# Patient Record
Sex: Male | Born: 1995 | Race: White | Marital: Single | Smoking: Never smoker
Health system: Southern US, Community
[De-identification: ages and names within clinical notes are randomized; demographics above are authoritative.]

---
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
## 2023-02-13 ENCOUNTER — Emergency Department (HOSPITAL_COMMUNITY): Payer: Self-pay

## 2023-02-13 ENCOUNTER — Encounter (HOSPITAL_COMMUNITY): Payer: Self-pay

## 2023-02-13 ENCOUNTER — Other Ambulatory Visit: Payer: Self-pay

## 2023-02-13 ENCOUNTER — Inpatient Hospital Stay (HOSPITAL_COMMUNITY)
Admission: EM | Admit: 2023-02-13 | Discharge: 2023-02-14 | DRG: 981 | Disposition: A | Discharge status: Home / Self Care | Payer: Self-pay | Attending: Student | Admitting: Student

## 2023-02-13 DIAGNOSIS — S72411B Displaced unspecified condyle fracture of lower end of right femur, initial encounter for open fracture type I or II: Secondary | ICD-10-CM | POA: Insufficient documentation

## 2023-02-13 DIAGNOSIS — M109 Gout, unspecified: Secondary | ICD-10-CM | POA: Diagnosis present

## 2023-02-13 DIAGNOSIS — S81031A Puncture wound without foreign body, right knee, initial encounter: Principal | ICD-10-CM | POA: Diagnosis present

## 2023-02-13 DIAGNOSIS — S72431B Displaced fracture of medial condyle of right femur, initial encounter for open fracture type I or II: Secondary | ICD-10-CM | POA: Diagnosis present

## 2023-02-13 DIAGNOSIS — M12561 Traumatic arthropathy, right knee: Secondary | ICD-10-CM | POA: Diagnosis present

## 2023-02-13 DIAGNOSIS — S0121XA Laceration without foreign body of nose, initial encounter: Secondary | ICD-10-CM | POA: Diagnosis present

## 2023-02-13 DIAGNOSIS — S022XXA Fracture of nasal bones, initial encounter for closed fracture: Secondary | ICD-10-CM | POA: Diagnosis present

## 2023-02-13 DIAGNOSIS — Y92410 Unspecified street and highway as the place of occurrence of the external cause: Secondary | ICD-10-CM | POA: Diagnosis not present

## 2023-02-13 DIAGNOSIS — M12569 Traumatic arthropathy, unspecified knee: Secondary | ICD-10-CM | POA: Diagnosis present

## 2023-02-13 DIAGNOSIS — Z23 Encounter for immunization: Secondary | ICD-10-CM

## 2023-02-13 LAB — COMPREHENSIVE METABOLIC PANEL
ALT: 21 U/L (ref 0–44)
AST: 28 U/L (ref 15–41)
Albumin: 4.7 g/dL (ref 3.5–5.0)
Alkaline Phosphatase: 64 U/L (ref 38–126)
Anion gap: 15 (ref 5–15)
BUN: 16 mg/dL (ref 6–20)
CO2: 25 mmol/L (ref 22–32)
Calcium: 9.5 mg/dL (ref 8.9–10.3)
Chloride: 95 mmol/L — ABNORMAL LOW (ref 98–111)
Creatinine, Ser: 0.91 mg/dL (ref 0.61–1.24)
GFR, Estimated: >60 mL/min (ref >60)
Glucose, Bld: 103 mg/dL — ABNORMAL HIGH (ref 70–99)
Potassium: 3.4 mmol/L — ABNORMAL LOW (ref 3.5–5.1)
Sodium: 135 mmol/L (ref 135–145)
Total Bilirubin: 1.1 mg/dL (ref 0.3–1.2)
Total Protein: 7.2 g/dL (ref 6.5–8.1)

## 2023-02-13 LAB — CBC WITH DIFFERENTIAL/PLATELET
Abs Immature Granulocytes: 0.17 10*3/uL — ABNORMAL HIGH (ref 0.00–0.07)
Basophils Absolute: 0.1 10*3/uL (ref 0.0–0.1)
Basophils Relative: 0 %
Eosinophils Absolute: 0.1 10*3/uL (ref 0.0–0.5)
Eosinophils Relative: 0 %
HCT: 45.2 % (ref 39.0–52.0)
Hemoglobin: 16.8 g/dL (ref 13.0–17.0)
Immature Granulocytes: 1 %
Lymphocytes Relative: 8 %
Lymphs Abs: 1.5 10*3/uL (ref 0.7–4.0)
MCH: 31.5 pg (ref 26.0–34.0)
MCHC: 37.2 g/dL — ABNORMAL HIGH (ref 30.0–36.0)
MCV: 84.6 fL (ref 80.0–100.0)
Monocytes Absolute: 1.3 10*3/uL — ABNORMAL HIGH (ref 0.1–1.0)
Monocytes Relative: 7 %
Neutro Abs: 17 10*3/uL — ABNORMAL HIGH (ref 1.7–7.7)
Neutrophils Relative %: 84 %
Platelets: 279 10*3/uL (ref 150–400)
RBC: 5.34 MIL/uL (ref 4.22–5.81)
RDW: 11.3 % — ABNORMAL LOW (ref 11.5–15.5)
WBC: 20.2 10*3/uL — ABNORMAL HIGH (ref 4.0–10.5)
nRBC: 0 % (ref 0.0–0.2)

## 2023-02-13 LAB — HIV ANTIBODY (ROUTINE TESTING W REFLEX): HIV Screen 4th Generation wRfx: NONREACTIVE

## 2023-02-13 LAB — ETHANOL: Alcohol, Ethyl (B): <10 mg/dL (ref <10)

## 2023-02-13 MED ORDER — IBUPROFEN 800 MG PO TABS: 800.0000 mg | ORAL_TABLET | Freq: Once | ORAL | Status: DC

## 2023-02-13 MED ORDER — OXYCODONE-ACETAMINOPHEN 5-325 MG PO TABS
1.0000 | ORAL_TABLET | Freq: Once | ORAL | Status: AC
  Administered 2023-02-13: 1 via ORAL
  Filled 2023-02-13: qty 1

## 2023-02-13 MED ORDER — CEFAZOLIN SODIUM-DEXTROSE 2-4 GM/100ML-% IV SOLN
2.0000 g | Freq: Three times a day (TID) | INTRAVENOUS | Status: DC
  Administered 2023-02-14 (×2): 2 g via INTRAVENOUS
  Filled 2023-02-13: qty 100

## 2023-02-13 MED ORDER — DOCUSATE SODIUM 100 MG PO CAPS
100.0000 mg | ORAL_CAPSULE | Freq: Two times a day (BID) | ORAL | Status: DC
  Administered 2023-02-13 – 2023-02-14 (×2): 100 mg via ORAL
  Filled 2023-02-13 (×2): qty 1

## 2023-02-13 MED ORDER — OXYCODONE HCL 5 MG PO TABS
5.0000 mg | ORAL_TABLET | ORAL | Status: DC | PRN
  Filled 2023-02-13: qty 2

## 2023-02-13 MED ORDER — METHOCARBAMOL 500 MG PO TABS: 1000.0000 mg | ORAL_TABLET | Freq: Three times a day (TID) | ORAL | Status: DC | PRN

## 2023-02-13 MED ORDER — ACETAMINOPHEN 500 MG PO TABS
1000.0000 mg | ORAL_TABLET | Freq: Four times a day (QID) | ORAL | Status: DC
  Administered 2023-02-13 – 2023-02-14 (×3): 1000 mg via ORAL
  Filled 2023-02-13 (×3): qty 2

## 2023-02-13 MED ORDER — METHOCARBAMOL 1000 MG/10ML IJ SOLN: 500.0000 mg | Freq: Three times a day (TID) | INTRAVENOUS | Status: DC | PRN

## 2023-02-13 MED ORDER — LACTATED RINGERS IV SOLN: INTRAVENOUS | Status: DC

## 2023-02-13 MED ORDER — ACETAMINOPHEN 325 MG PO TABS: 325.0000 mg | ORAL_TABLET | Freq: Four times a day (QID) | ORAL | Status: DC | PRN

## 2023-02-13 MED ORDER — CEFAZOLIN SODIUM-DEXTROSE 2-4 GM/100ML-% IV SOLN
2.0000 g | Freq: Once | INTRAVENOUS | Status: AC
  Administered 2023-02-13: 2 g via INTRAVENOUS
  Filled 2023-02-13: qty 100

## 2023-02-13 MED ORDER — HYDROCODONE-ACETAMINOPHEN 5-325 MG PO TABS
1.0000 | ORAL_TABLET | Freq: Once | ORAL | Status: AC
  Administered 2023-02-13: 1 via ORAL
  Filled 2023-02-13: qty 1

## 2023-02-13 MED ORDER — LIDOCAINE HCL (PF) 1 % IJ SOLN
5.0000 mL | Freq: Once | INTRAMUSCULAR | Status: AC
  Administered 2023-02-13: 5 mL
  Filled 2023-02-13: qty 5

## 2023-02-13 MED ORDER — KETOROLAC TROMETHAMINE 15 MG/ML IJ SOLN
15.0000 mg | Freq: Four times a day (QID) | INTRAMUSCULAR | Status: DC
  Administered 2023-02-13 – 2023-02-14 (×3): 15 mg via INTRAVENOUS
  Filled 2023-02-13 (×3): qty 1

## 2023-02-13 MED ORDER — IBUPROFEN 800 MG PO TABS
800.0000 mg | ORAL_TABLET | Freq: Once | ORAL | Status: AC
  Administered 2023-02-13: 800 mg via ORAL
  Filled 2023-02-13: qty 1

## 2023-02-13 MED ORDER — BISACODYL 5 MG PO TBEC: 5.0000 mg | DELAYED_RELEASE_TABLET | Freq: Every day | ORAL | Status: DC | PRN

## 2023-02-13 MED ORDER — HYDROMORPHONE HCL 1 MG/ML IJ SOLN: 0.5000 mg | INTRAMUSCULAR | Status: DC | PRN

## 2023-02-13 MED ORDER — TETANUS-DIPHTH-ACELL PERTUSSIS 5-2.5-18.5 LF-MCG/0.5 IM SUSY
0.5000 mL | PREFILLED_SYRINGE | Freq: Once | INTRAMUSCULAR | Status: AC
  Administered 2023-02-13: 0.5 mL via INTRAMUSCULAR
  Filled 2023-02-13: qty 0.5

## 2023-02-13 MED ORDER — MAGNESIUM HYDROXIDE 400 MG/5ML PO SUSP: 30.0000 mL | Freq: Every day | ORAL | Status: DC | PRN
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
## 2023-02-13 NOTE — Progress Notes (Signed)
Tried to do initial assessment but pt sleeping well.  Pt partner stated that he just requested pain medicine but pt now sleeping well.
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
## 2023-02-14 ENCOUNTER — Inpatient Hospital Stay (HOSPITAL_COMMUNITY): Payer: Self-pay | Admitting: Certified Registered Nurse Anesthetist

## 2023-02-14 ENCOUNTER — Other Ambulatory Visit: Payer: Self-pay

## 2023-02-14 ENCOUNTER — Encounter (HOSPITAL_COMMUNITY)
Admission: EM | Disposition: A | Discharge status: Home / Self Care | Payer: Self-pay | Source: Home / Self Care | Attending: Orthopedic Surgery

## 2023-02-14 DIAGNOSIS — M12561 Traumatic arthropathy, right knee: Secondary | ICD-10-CM

## 2023-02-14 DIAGNOSIS — S72431B Displaced fracture of medial condyle of right femur, initial encounter for open fracture type I or II: Secondary | ICD-10-CM

## 2023-02-14 DIAGNOSIS — S72411B Displaced unspecified condyle fracture of lower end of right femur, initial encounter for open fracture type I or II: Secondary | ICD-10-CM | POA: Insufficient documentation

## 2023-02-14 HISTORY — PX: KNEE ARTHROTOMY: SHX5881

## 2023-02-14 LAB — SURGICAL PCR SCREEN
MRSA, PCR: NEGATIVE
Staphylococcus aureus: NEGATIVE

## 2023-02-14 LAB — RAPID URINE DRUG SCREEN, HOSP PERFORMED
Amphetamines: NOT DETECTED
Barbiturates: NOT DETECTED
Benzodiazepines: NOT DETECTED
Cocaine: NOT DETECTED
Opiates: POSITIVE — AB
Tetrahydrocannabinol: NOT DETECTED

## 2023-02-14 SURGERY — ARTHROTOMY, KNEE
Anesthesia: General | Site: Knee | Laterality: Right

## 2023-02-14 MED ORDER — CEFAZOLIN SODIUM-DEXTROSE 2-4 GM/100ML-% IV SOLN
2.0000 g | Freq: Three times a day (TID) | INTRAVENOUS | Status: DC
  Administered 2023-02-14: 2 g via INTRAVENOUS
  Filled 2023-02-14: qty 100

## 2023-02-14 MED ORDER — FENTANYL CITRATE (PF) 100 MCG/2ML IJ SOLN: 25.0000 ug | INTRAMUSCULAR | Status: DC | PRN

## 2023-02-14 MED ORDER — ONDANSETRON HCL 4 MG/2ML IJ SOLN
INTRAMUSCULAR | Status: DC | PRN
  Administered 2023-02-14: 4 mg via INTRAVENOUS

## 2023-02-14 MED ORDER — LIDOCAINE 2% (20 MG/ML) 5 ML SYRINGE
INTRAMUSCULAR | Status: AC
  Filled 2023-02-14: qty 5

## 2023-02-14 MED ORDER — DEXAMETHASONE SODIUM PHOSPHATE 10 MG/ML IJ SOLN
INTRAMUSCULAR | Status: DC | PRN
  Administered 2023-02-14: 10 mg via INTRAVENOUS

## 2023-02-14 MED ORDER — FENTANYL CITRATE (PF) 250 MCG/5ML IJ SOLN
INTRAMUSCULAR | Status: DC | PRN
  Administered 2023-02-14: 50 ug via INTRAVENOUS
  Administered 2023-02-14: 25 ug via INTRAVENOUS

## 2023-02-14 MED ORDER — MIDAZOLAM HCL 2 MG/2ML IJ SOLN
INTRAMUSCULAR | Status: AC
  Filled 2023-02-14: qty 2

## 2023-02-14 MED ORDER — PHENYLEPHRINE 80 MCG/ML (10ML) SYRINGE FOR IV PUSH (FOR BLOOD PRESSURE SUPPORT)
PREFILLED_SYRINGE | INTRAVENOUS | Status: AC
  Filled 2023-02-14: qty 10

## 2023-02-14 MED ORDER — CHLORHEXIDINE GLUCONATE 4 % EX LIQD
60.0000 mL | Freq: Once | CUTANEOUS | Status: AC
  Administered 2023-02-14: 4 via TOPICAL
  Filled 2023-02-14: qty 60

## 2023-02-14 MED ORDER — POVIDONE-IODINE 10 % EX SWAB: 2.0000 | Freq: Once | CUTANEOUS | Status: DC

## 2023-02-14 MED ORDER — ASPIRIN 81 MG PO TBEC: 81.0000 mg | DELAYED_RELEASE_TABLET | Freq: Every day | ORAL | 0 refills | Status: AC

## 2023-02-14 MED ORDER — CEPHALEXIN 500 MG PO CAPS: 500.0000 mg | ORAL_CAPSULE | Freq: Four times a day (QID) | ORAL | 0 refills | Status: AC

## 2023-02-14 MED ORDER — PROPOFOL 10 MG/ML IV BOLUS
INTRAVENOUS | Status: DC | PRN
  Administered 2023-02-14: 200 mg via INTRAVENOUS

## 2023-02-14 MED ORDER — ONDANSETRON HCL 4 MG/2ML IJ SOLN: 4.0000 mg | Freq: Four times a day (QID) | INTRAMUSCULAR | Status: DC | PRN

## 2023-02-14 MED ORDER — LACTATED RINGERS IV SOLN: INTRAVENOUS | Status: DC | PRN

## 2023-02-14 MED ORDER — VANCOMYCIN HCL 1000 MG IV SOLR
INTRAVENOUS | Status: AC
  Filled 2023-02-14: qty 20

## 2023-02-14 MED ORDER — BUPIVACAINE HCL (PF) 0.5 % IJ SOLN
INTRAMUSCULAR | Status: DC | PRN
  Administered 2023-02-14: 25 mL via PERINEURAL

## 2023-02-14 MED ORDER — FENTANYL CITRATE (PF) 250 MCG/5ML IJ SOLN
INTRAMUSCULAR | Status: AC
  Filled 2023-02-14: qty 5

## 2023-02-14 MED ORDER — ROCURONIUM BROMIDE 10 MG/ML (PF) SYRINGE
PREFILLED_SYRINGE | INTRAVENOUS | Status: AC
  Filled 2023-02-14: qty 10

## 2023-02-14 MED ORDER — CEFAZOLIN SODIUM-DEXTROSE 2-4 GM/100ML-% IV SOLN
INTRAVENOUS | Status: AC
  Filled 2023-02-14: qty 100

## 2023-02-14 MED ORDER — MIDAZOLAM HCL 2 MG/2ML IJ SOLN
INTRAMUSCULAR | Status: DC | PRN
  Administered 2023-02-14 (×2): 1 mg via INTRAVENOUS

## 2023-02-14 MED ORDER — PROPOFOL 10 MG/ML IV BOLUS
INTRAVENOUS | Status: AC
  Filled 2023-02-14: qty 20

## 2023-02-14 MED ORDER — OXYCODONE HCL 5 MG/5ML PO SOLN: 5.0000 mg | Freq: Once | ORAL | Status: DC | PRN

## 2023-02-14 MED ORDER — LIDOCAINE 2% (20 MG/ML) 5 ML SYRINGE
INTRAMUSCULAR | Status: DC | PRN
  Administered 2023-02-14: 60 mg via INTRAVENOUS

## 2023-02-14 MED ORDER — OXYCODONE HCL 5 MG PO TABS: 5.0000 mg | ORAL_TABLET | Freq: Once | ORAL | Status: DC | PRN

## 2023-02-14 MED ORDER — DEXAMETHASONE SODIUM PHOSPHATE 10 MG/ML IJ SOLN
INTRAMUSCULAR | Status: AC
  Filled 2023-02-14: qty 1

## 2023-02-14 MED ORDER — ONDANSETRON HCL 4 MG/2ML IJ SOLN
INTRAMUSCULAR | Status: AC
  Filled 2023-02-14: qty 2

## 2023-02-14 MED ORDER — 0.9 % SODIUM CHLORIDE (POUR BTL) OPTIME
TOPICAL | Status: DC | PRN
  Administered 2023-02-14: 1000 mL

## 2023-02-14 MED ORDER — OXYCODONE-ACETAMINOPHEN 5-325 MG PO TABS: 1.0000 | ORAL_TABLET | ORAL | 0 refills | Status: AC | PRN

## 2023-02-14 MED ORDER — VANCOMYCIN HCL 1000 MG IV SOLR
INTRAVENOUS | Status: DC | PRN
  Administered 2023-02-14: 1000 mg via TOPICAL

## 2023-02-14 MED ORDER — METHOCARBAMOL 1000 MG PO TABS: 500.0000 mg | ORAL_TABLET | Freq: Four times a day (QID) | ORAL | 0 refills | Status: AC | PRN

## 2023-02-14 SURGICAL SUPPLY — 50 items
APL PRP STRL LF DISP 70% ISPRP (MISCELLANEOUS) ×1
BAG COUNTER SPONGE SURGICOUNT (BAG) ×1 IMPLANT
BAG SPNG CNTER NS LX DISP (BAG)
BNDG COHESIVE 4X5 TAN STRL (GAUZE/BANDAGES/DRESSINGS) ×1 IMPLANT
BNDG ELASTIC 4X5.8 VLCR STR LF (GAUZE/BANDAGES/DRESSINGS) IMPLANT
BNDG GAUZE DERMACEA FLUFF 4 (GAUZE/BANDAGES/DRESSINGS) ×2 IMPLANT
BNDG GZE DERMACEA 4 6PLY (GAUZE/BANDAGES/DRESSINGS)
BRUSH SCRUB EZ PLAIN DRY (MISCELLANEOUS) ×2 IMPLANT
CHLORAPREP W/TINT 26 (MISCELLANEOUS) ×1 IMPLANT
COVER MAYO STAND STRL (DRAPES) ×1 IMPLANT
COVER SURGICAL LIGHT HANDLE (MISCELLANEOUS) ×2 IMPLANT
DRAPE ORTHO SPLIT 77X108 STRL (DRAPES) ×1
DRAPE SURG 17X23 STRL (DRAPES) ×1 IMPLANT
DRAPE SURG ORHT 6 SPLT 77X108 (DRAPES) ×1 IMPLANT
DRAPE U-SHAPE 47X51 STRL (DRAPES) ×1 IMPLANT
DRSG ADAPTIC 3X8 NADH LF (GAUZE/BANDAGES/DRESSINGS) ×1 IMPLANT
DRSG EMULSION OIL 3X3 NADH (GAUZE/BANDAGES/DRESSINGS) IMPLANT
ELECT REM PT RETURN 9FT ADLT (ELECTROSURGICAL) ×1
ELECTRODE REM PT RTRN 9FT ADLT (ELECTROSURGICAL) IMPLANT
EVACUATOR 1/8 PVC DRAIN (DRAIN) IMPLANT
GAUZE SPONGE 4X4 12PLY STRL (GAUZE/BANDAGES/DRESSINGS) ×1 IMPLANT
GLOVE BIO SURGEON STRL SZ 6.5 (GLOVE) ×3 IMPLANT
GLOVE BIO SURGEON STRL SZ7.5 (GLOVE) ×4 IMPLANT
GLOVE BIOGEL PI IND STRL 6.5 (GLOVE) ×1 IMPLANT
GLOVE BIOGEL PI IND STRL 7.5 (GLOVE) ×1 IMPLANT
GOWN STRL REUS W/ TWL LRG LVL3 (GOWN DISPOSABLE) ×2 IMPLANT
GOWN STRL REUS W/TWL LRG LVL3 (GOWN DISPOSABLE) ×2
HANDPIECE INTERPULSE COAX TIP (DISPOSABLE)
KIT BASIN OR (CUSTOM PROCEDURE TRAY) ×1 IMPLANT
KIT TURNOVER KIT B (KITS) ×1 IMPLANT
MANIFOLD NEPTUNE II (INSTRUMENTS) ×1 IMPLANT
NS IRRIG 1000ML POUR BTL (IV SOLUTION) ×1 IMPLANT
PACK ORTHO EXTREMITY (CUSTOM PROCEDURE TRAY) ×1 IMPLANT
PAD ARMBOARD 7.5X6 YLW CONV (MISCELLANEOUS) ×2 IMPLANT
PADDING CAST COTTON 6X4 STRL (CAST SUPPLIES) ×1 IMPLANT
PADDING CAST SYNTHETIC 4X4 STR (CAST SUPPLIES) IMPLANT
SET CYSTO W/LG BORE CLAMP LF (SET/KITS/TRAYS/PACK) IMPLANT
SET HNDPC FAN SPRY TIP SCT (DISPOSABLE) IMPLANT
SPONGE T-LAP 18X18 ~~LOC~~+RFID (SPONGE) ×1 IMPLANT
SUT ETHILON 2 0 FS 18 (SUTURE) ×2 IMPLANT
SUT ETHILON 3 0 PS 1 (SUTURE) ×2 IMPLANT
SUT MON AB 2-0 CT1 36 (SUTURE) ×1 IMPLANT
SUT PDS AB 0 CT 36 (SUTURE) IMPLANT
SWAB CULTURE ESWAB REG 1ML (MISCELLANEOUS) IMPLANT
TOWEL GREEN STERILE (TOWEL DISPOSABLE) ×2 IMPLANT
TOWEL GREEN STERILE FF (TOWEL DISPOSABLE) ×1 IMPLANT
TUBE CONNECTING 12X1/4 (SUCTIONS) ×1 IMPLANT
UNDERPAD 30X36 HEAVY ABSORB (UNDERPADS AND DIAPERS) ×1 IMPLANT
WATER STERILE IRR 1000ML POUR (IV SOLUTION) ×1 IMPLANT
YANKAUER SUCT BULB TIP NO VENT (SUCTIONS) ×1 IMPLANT

## 2023-02-14 NOTE — TOC Transition Note (Signed)
Transition of Care [facility]) - CM/SW Discharge Note   Patient Details  Name: Jacob Dominguez MRN: [id number] Date of Birth: 02/03/1996  Transition of [facility]) CM/SW Contact:  Curlene Labrum, RN Phone Number: 02/14/2023, 11:02 AM   Clinical Narrative:      Transition of Care (TOC) Screening Note   Patient Details  Name: Jacob Dominguez Date of Birth: 11-10-1996   [facility] of [facility]) CM/SW Contact:    Curlene Labrum, RN Phone Number: 02/14/2023, 11:03 AM   RN Charge RN given number to contact ortho tech to obtain crutches for home.  Transition of Care Department [facility]) has reviewed patient and no TOC needs have been identified at this time.   We will continue to monitor patient advancement through interdisciplinary progression rounds. If new patient transition needs arise, please place a TOC consult.          Patient Goals and CMS Choice      Discharge Placement                         Discharge Plan and Services Additional resources added to the After Visit Summary for                                       Social Determinants of Health (SDOH) Interventions SDOH Screenings   Tobacco Use: Low Risk  (02/13/2023)     Readmission Risk Interventions     No data to display

## 2023-02-14 NOTE — Transfer of Care (Signed)
Immediate Anesthesia Transfer of Care Note  Patient: Jacob Dominguez  Procedure(s) Performed: OPEN RIGHT KNEE ARTHROTOMY, IRRIGATION AND DEBRIEMNET OF RIGHT KNEE, DEBRIDEMENT OPEN RIGHT MEDIAL FEMORAL CONDYLE FRACTURE (Right: Knee)  Patient Location: PACU  Anesthesia Type:General and Regional  Level of Consciousness: drowsy and patient cooperative  Airway & Oxygen Therapy: Patient Spontanous Breathing and Patient connected to face mask oxygen  Post-op Assessment: Report given to RN and Post -op Vital signs reviewed and stable  Post vital signs: Reviewed and stable  Last Vitals:  Vitals Value Taken Time  BP 110/68 02/14/23 0845  Temp    Pulse 74 02/14/23 0846  Resp 20 02/14/23 0846  SpO2 97 % 02/14/23 0846  Vitals shown include unvalidated device data.  Last Pain:  Vitals:   02/14/23 0328  TempSrc: Oral  PainSc:          Complications: No notable events documented.
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
## 2023-02-14 NOTE — Discharge Instructions (Addendum)
Orthopaedic Trauma Service Discharge Instructions   General Discharge Instructions  WEIGHT BEARING STATUS: Weightbearing as tolerated right lower extremity  RANGE OF MOTION/ACTIVITY: Ok for knee range of motion as tolerated  Wound Care: You may remove your surgical dressing on post-op day #2, (Sunday 02/16/23). Incisions can be left open to air if there is no drainage. Once the incision is completely dry and without drainage, it may be left open to air out.  Showering may begin post-op day #3, (Monday 02/17/23).  Clean incision gently with soap and water.  DVT/PE prophylaxis: Aspirin 81 mg daily x 30 days  Diet: as you were eating previously.  Can use over the counter stool softeners and bowel preparations, such as Miralax, to help with bowel movements.  Narcotics can be constipating.  Be sure to drink plenty of fluids  PAIN MEDICATION USE AND EXPECTATIONS  You have likely been given narcotic medications to help control your pain.  After a traumatic event that results in an fracture (broken bone) with or without surgery, it is ok to use narcotic pain medications to help control one's pain.  We understand that everyone responds to pain differently and each individual patient will be evaluated on a regular basis for the continued need for narcotic medications. Ideally, narcotic medication use should last no more than 6-8 weeks (coinciding with fracture healing).   As a patient it is your responsibility as well to monitor narcotic medication use and report the amount and frequency you use these medications when you come to your office visit.   We would also advise that if you are using narcotic medications, you should take a dose prior to therapy to maximize you participation.  IF YOU ARE ON NARCOTIC MEDICATIONS IT IS NOT PERMISSIBLE TO OPERATE A MOTOR VEHICLE (MOTORCYCLE/CAR/TRUCK/MOPED) OR HEAVY MACHINERY DO NOT MIX NARCOTICS WITH OTHER CNS (CENTRAL NERVOUS SYSTEM) DEPRESSANTS SUCH AS  ALCOHOL   STOP SMOKING OR USING NICOTINE PRODUCTS!!!!  As discussed nicotine severely impairs your body's ability to heal surgical and traumatic wounds but also impairs bone healing.  Wounds and bone heal by forming microscopic blood vessels (angiogenesis) and nicotine is a vasoconstrictor (essentially, shrinks blood vessels).  Therefore, if vasoconstriction occurs to these microscopic blood vessels they essentially disappear and are unable to deliver necessary nutrients to the healing tissue.  This is one modifiable factor that you can do to dramatically increase your chances of healing your injury.    (This means no smoking, no nicotine gum, patches, etc)  DO NOT USE NONSTEROIDAL ANTI-INFLAMMATORY DRUGS (NSAID'S)  Using products such as Advil (ibuprofen), Aleve (naproxen), Motrin (ibuprofen) for additional pain control during fracture healing can delay and/or prevent the healing response.  If you would like to take over the counter (OTC) medication, Tylenol (acetaminophen) is ok.  However, some narcotic medications that are given for pain control contain acetaminophen as well. Therefore, you should not exceed more than 4000 mg of tylenol in a day if you do not have liver disease.  Also note that there are may OTC medicines, such as cold medicines and allergy medicines that my contain tylenol as well.  If you have any questions about medications and/or interactions please ask your doctor/PA or your pharmacist.      ICE AND ELEVATE INJURED/OPERATIVE EXTREMITY  Using ice and elevating the injured extremity above your heart can help with swelling and pain control.  Icing in a pulsatile fashion, such as 20 minutes on and 20 minutes off, can be followed.  Do not place ice directly on skin. Make sure there is a barrier between to skin and the ice pack.    Using frozen items such as frozen peas works well as the conform nicely to the are that needs to be iced.  USE AN ACE WRAP OR TED HOSE FOR SWELLING  CONTROL  In addition to icing and elevation, Ace wraps or TED hose are used to help limit and resolve swelling.  It is recommended to use Ace wraps or TED hose until you are informed to stop.    When using Ace Wraps start the wrapping distally (farthest away from the body) and wrap proximally (closer to the body)   Example: If you had surgery on your leg or thing and you do not have a splint on, start the ace wrap at the toes and work your way up to the thigh        If you had surgery on your upper extremity and do not have a splint on, start the ace wrap at your fingers and work your way up to the upper arm   [place]: [phone]   VISIT OUR WEBSITE FOR ADDITIONAL INFORMATION: [email or web address]    Discharge Wound Care Instructions  Do NOT apply any ointments, solutions or lotions to pin sites or surgical wounds.  These prevent needed drainage and even though solutions like hydrogen peroxide kill bacteria, they also damage cells lining the pin sites that help fight infection.  Applying lotions or ointments can keep the wounds moist and can cause them to breakdown and open up as well. This can increase the risk for infection. When in doubt call the office.   If any drainage is noted, use one layer of adaptic or Mepitel, then gauze, Kerlix, and an ace wrap. - These dressing supplies should be available at local medical supply stores [facility], etc) as well as Management consultant ([employer], [facility], [place], etc)  Once the incision is completely dry and without drainage, it may be left open to air out.  Showering may begin 36-48 hours later.  Cleaning gently with soap and water. Traumatic wounds should be dressed daily as well.    One layer of adaptic, gauze, Kerlix, then ace wrap.  The adaptic can be discontinued once the draining has ceased    If you have a wet to dry dressing: wet the gauze with saline the squeeze as much saline out so  the gauze is moist (not soaking wet), place moistened gauze over wound, then place a dry gauze over the moist one, followed by Kerlix wrap, then ace wrap.
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
## 2023-02-14 NOTE — Anesthesia Procedure Notes (Signed)
Anesthesia Regional Block: Adductor canal block   Pre-Anesthetic Checklist: , timeout performed,  Correct Patient, Correct Site, Correct Laterality,  Correct Procedure, Correct Position, site marked,  Risks and benefits discussed,  Surgical consent,  Pre-op evaluation,  At surgeon's request and post-op pain management  Laterality: Right  Prep: chloraprep       Needles:  Injection technique: Single-shot  Needle Type: Echogenic Needle     Needle Length: 9cm  Needle Gauge: 21     Additional Needles:   Narrative:  Start time: 02/14/2023 7:05 AM End time: 02/14/2023 7:14 AM Injection made incrementally with aspirations every 5 mL.  Performed by: Personally  Anesthesiologist: Albertha Ghee, MD  Additional Notes: Pt tolerated the procedure well.

## 2023-02-14 NOTE — Anesthesia Postprocedure Evaluation (Signed)
Anesthesia Post Note  Patient: Jacob Dominguez  Procedure(s) Performed: OPEN RIGHT KNEE ARTHROTOMY, IRRIGATION AND DEBRIEMNET OF RIGHT KNEE, DEBRIDEMENT OPEN RIGHT MEDIAL FEMORAL CONDYLE FRACTURE (Right: Knee)     Patient location during evaluation: PACU Anesthesia Type: General and Regional Level of consciousness: awake and alert Pain management: pain level controlled Vital Signs Assessment: post-procedure vital signs reviewed and stable Respiratory status: spontaneous breathing, nonlabored ventilation, respiratory function stable and patient connected to nasal cannula oxygen Cardiovascular status: blood pressure returned to baseline and stable Postop Assessment: no apparent nausea or vomiting Anesthetic complications: no   No notable events documented.  Last Vitals:  Vitals:   02/14/23 0915 02/14/23 0938  BP: 115/83 115/76  Pulse: (!) 59 (!) 54  Resp: 13 18  Temp: (!) 36.4 C (!) 36.3 C  SpO2: 99% 100%    Last Pain:  Vitals:   02/14/23 0938  TempSrc: Oral  PainSc: 0-No pain                 Calil Amor S

## 2023-02-14 NOTE — Anesthesia Procedure Notes (Signed)
Procedure Name: LMA Insertion Date/Time: 02/14/2023 7:50 AM  Performed by: Janene Harvey, CRNAPre-anesthesia Checklist: Patient identified, Emergency Drugs available, Suction available and Patient being monitored Patient Re-evaluated:Patient Re-evaluated prior to induction Oxygen Delivery Method: Circle system utilized Preoxygenation: Pre-oxygenation with 100% oxygen Induction Type: IV induction LMA: LMA inserted LMA Size: 4.0 Placement Confirmation: positive ETCO2 Tube secured with: Tape Dental Injury: Teeth and Oropharynx as per pre-operative assessment

## 2023-02-14 NOTE — Progress Notes (Signed)
Orthopedic Tech Progress Note Patient Details:  Jacob Dominguez [birth year] [id number]  Ortho Devices Type of Ortho Device: Crutches Ortho Device/Splint Interventions: Ordered, Application, Adjustment   Post Interventions Patient Tolerated: Well Instructions Provided: Adjustment of device, Care of device  Chicken 02/14/2023, 11:45 AM
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
## 2023-02-14 NOTE — Anesthesia Preprocedure Evaluation (Signed)
Anesthesia Evaluation  Patient identified by MRN, date of birth, ID band Patient awake    Reviewed: Allergy & Precautions, H&P , NPO status , Patient's Chart, lab work & pertinent test results  Airway Mallampati: II   Neck ROM: full    Dental   Pulmonary neg pulmonary ROS   breath sounds clear to auscultation       Cardiovascular negative cardio ROS  Rhythm:regular Rate:Normal     Neuro/Psych    GI/Hepatic   Endo/Other    Renal/GU      Musculoskeletal   Abdominal   Peds  Hematology   Anesthesia Other Findings   Reproductive/Obstetrics                             Anesthesia Physical Anesthesia Plan  ASA: 1  Anesthesia Plan: General   Post-op Pain Management: Regional block*   Induction: Intravenous  PONV Risk Score and Plan: 2 and Ondansetron, Dexamethasone, Midazolam and Treatment may vary due to age or medical condition  Airway Management Planned: LMA  Additional Equipment:   Intra-op Plan:   Post-operative Plan: Extubation in OR  Informed Consent: I have reviewed the patients History and Physical, chart, labs and discussed the procedure including the risks, benefits and alternatives for the proposed anesthesia with the patient or authorized representative who has indicated his/her understanding and acceptance.     Dental advisory given  Plan Discussed with: CRNA, Anesthesiologist and Surgeon  Anesthesia Plan Comments:        Anesthesia Quick Evaluation
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
## 2023-02-15 ENCOUNTER — Encounter (HOSPITAL_COMMUNITY): Payer: Self-pay | Admitting: Student

## 2023-06-04 ENCOUNTER — Encounter: Payer: Self-pay | Admitting: Otolaryngology

## 2023-06-04 ENCOUNTER — Encounter: Payer: Self-pay | Admitting: Neurosurgery
